# Patient Record
Sex: Female | Born: 1980 | Marital: Married | Smoking: Former smoker
Health system: Southern US, Community
[De-identification: ages and names within clinical notes are randomized; demographics above are authoritative.]

## PROBLEM LIST (undated history)

## (undated) DIAGNOSIS — T7840XA Allergy, unspecified, initial encounter: Secondary | ICD-10-CM

## (undated) DIAGNOSIS — M47816 Spondylosis without myelopathy or radiculopathy, lumbar region: Secondary | ICD-10-CM

## (undated) HISTORY — PX: WISDOM TOOTH EXTRACTION: SHX21

## (undated) HISTORY — DX: Allergy, unspecified, initial encounter: T78.40XA

## (undated) HISTORY — DX: Spondylosis without myelopathy or radiculopathy, lumbar region: M47.816

---

## 2012-09-19 ENCOUNTER — Ambulatory Visit (INDEPENDENT_AMBULATORY_CARE_PROVIDER_SITE_OTHER): Payer: Self-pay | Admitting: Family Medicine

## 2012-09-19 ENCOUNTER — Encounter: Payer: Self-pay | Admitting: Family Medicine

## 2012-09-19 VITALS — BP 112/70 | HR 62 | Ht 63.0 in | Wt 145.0 lb

## 2012-09-19 DIAGNOSIS — Z01419 Encounter for gynecological examination (general) (routine) without abnormal findings: Secondary | ICD-10-CM

## 2012-09-19 DIAGNOSIS — Z124 Encounter for screening for malignant neoplasm of cervix: Secondary | ICD-10-CM

## 2012-09-19 DIAGNOSIS — N390 Urinary tract infection, site not specified: Secondary | ICD-10-CM | POA: Insufficient documentation

## 2012-09-19 DIAGNOSIS — N915 Oligomenorrhea, unspecified: Secondary | ICD-10-CM

## 2012-09-19 DIAGNOSIS — M545 Low back pain: Secondary | ICD-10-CM | POA: Insufficient documentation

## 2012-09-19 DIAGNOSIS — Z1151 Encounter for screening for human papillomavirus (HPV): Secondary | ICD-10-CM

## 2012-09-19 DIAGNOSIS — N912 Amenorrhea, unspecified: Secondary | ICD-10-CM

## 2012-09-19 DIAGNOSIS — Z113 Encounter for screening for infections with a predominantly sexual mode of transmission: Secondary | ICD-10-CM

## 2012-09-19 DIAGNOSIS — R300 Dysuria: Secondary | ICD-10-CM | POA: Insufficient documentation

## 2012-09-19 LAB — POCT URINALYSIS DIPSTICK
Glucose, UA: NEGATIVE
Ketones, UA: NEGATIVE
Protein, UA: NEGATIVE

## 2012-09-19 MED ORDER — NORETHINDRONE-ETH ESTRADIOL 0.4-35 MG-MCG PO TABS: 1.0000 | ORAL_TABLET | Freq: Every day | ORAL | Status: DC

## 2012-09-19 NOTE — Patient Instructions (Signed)
Preventive Care for Adults, Female A healthy lifestyle and preventive care can promote health and wellness. Preventive health guidelines for women include the following key practices.  A routine yearly physical is a good way to check with your caregiver about your health and preventive screening. It is a chance to share any concerns and updates on your health, and to receive a thorough exam.  Visit your dentist for a routine exam and preventive care every 6 months. Brush your teeth twice a day and floss once a day. Good oral hygiene prevents tooth decay and gum disease.  The frequency of eye exams is based on your age, health, family medical history, use of contact lenses, and other factors. Follow your caregiver's recommendations for frequency of eye exams.  Eat a healthy diet. Foods like vegetables, fruits, whole grains, low-fat dairy products, and lean protein foods contain the nutrients you need without too many calories. Decrease your intake of foods high in solid fats, added sugars, and salt. Eat the right amount of calories for you.Get information about a proper diet from your caregiver, if necessary.  Regular physical exercise is one of the most important things you can do for your health. Most adults should get at least 150 minutes of moderate-intensity exercise (any activity that increases your heart rate and causes you to sweat) each week. In addition, most adults need muscle-strengthening exercises on 2 or more days a week.  Maintain a healthy weight. The body mass index (BMI) is a screening tool to identify possible weight problems. It provides an estimate of body fat based on height and weight. Your caregiver can help determine your BMI, and can help you achieve or maintain a healthy weight.For adults 20 years and older:  A BMI below 18.5 is considered underweight.  A BMI of 18.5 to 24.9 is normal.  A BMI of 25 to 29.9 is considered overweight.  A BMI of 30 and above is  considered obese.  Maintain normal blood lipids and cholesterol levels by exercising and minimizing your intake of saturated fat. Eat a balanced diet with plenty of fruit and vegetables. Blood tests for lipids and cholesterol should begin at age 20 and be repeated every 5 years. If your lipid or cholesterol levels are high, you are over 50, or you are at high risk for heart disease, you may need your cholesterol levels checked more frequently.Ongoing high lipid and cholesterol levels should be treated with medicines if diet and exercise are not effective.  If you smoke, find out from your caregiver how to quit. If you do not use tobacco, do not start.  If you are pregnant, do not drink alcohol. If you are breastfeeding, be very cautious about drinking alcohol. If you are not pregnant and choose to drink alcohol, do not exceed 1 drink per day. One drink is considered to be 12 ounces (355 mL) of beer, 5 ounces (148 mL) of wine, or 1.5 ounces (44 mL) of liquor.  Avoid use of street drugs. Do not share needles with anyone. Ask for help if you need support or instructions about stopping the use of drugs.  High blood pressure causes heart disease and increases the risk of stroke. Your blood pressure should be checked at least every 1 to 2 years. Ongoing high blood pressure should be treated with medicines if weight loss and exercise are not effective.  If you are 55 to 33 years old, ask your caregiver if you should take aspirin to prevent strokes.  Diabetes   screening involves taking a blood sample to check your fasting blood sugar level. This should be done once every 3 years, after age 45, if you are within normal weight and without risk factors for diabetes. Testing should be considered at a younger age or be carried out more frequently if you are overweight and have at least 1 risk factor for diabetes.  Breast cancer screening is essential preventive care for women. You should practice "breast  self-awareness." This means understanding the normal appearance and feel of your breasts and may include breast self-examination. Any changes detected, no matter how small, should be reported to a caregiver. Women in their 20s and 30s should have a clinical breast exam (CBE) by a caregiver as part of a regular health exam every 1 to 3 years. After age 40, women should have a CBE every year. Starting at age 40, women should consider having a mammography (breast X-ray test) every year. Women who have a family history of breast cancer should talk to their caregiver about genetic screening. Women at a high risk of breast cancer should talk to their caregivers about having magnetic resonance imaging (MRI) and a mammography every year.  The Pap test is a screening test for cervical cancer. A Pap test can show cell changes on the cervix that might become cervical cancer if left untreated. A Pap test is a procedure in which cells are obtained and examined from the lower end of the uterus (cervix).  Women should have a Pap test starting at age 21.  Between ages 21 and 29, Pap tests should be repeated every 2 years.  Beginning at age 30, you should have a Pap test every 3 years as long as the past 3 Pap tests have been normal.  Some women have medical problems that increase the chance of getting cervical cancer. Talk to your caregiver about these problems. It is especially important to talk to your caregiver if a new problem develops soon after your last Pap test. In these cases, your caregiver may recommend more frequent screening and Pap tests.  The above recommendations are the same for women who have or have not gotten the vaccine for human papillomavirus (HPV).  If you had a hysterectomy for a problem that was not cancer or a condition that could lead to cancer, then you no longer need Pap tests. Even if you no longer need a Pap test, a regular exam is a good idea to make sure no other problems are  starting.  If you are between ages 65 and 70, and you have had normal Pap tests going back 10 years, you no longer need Pap tests. Even if you no longer need a Pap test, a regular exam is a good idea to make sure no other problems are starting.  If you have had past treatment for cervical cancer or a condition that could lead to cancer, you need Pap tests and screening for cancer for at least 20 years after your treatment.  If Pap tests have been discontinued, risk factors (such as a new sexual partner) need to be reassessed to determine if screening should be resumed.  The HPV test is an additional test that may be used for cervical cancer screening. The HPV test looks for the virus that can cause the cell changes on the cervix. The cells collected during the Pap test can be tested for HPV. The HPV test could be used to screen women aged 30 years and older, and should   be used in women of any age who have unclear Pap test results. After the age of 30, women should have HPV testing at the same frequency as a Pap test.  Colorectal cancer can be detected and often prevented. Most routine colorectal cancer screening begins at the age of 50 and continues through age 75. However, your caregiver may recommend screening at an earlier age if you have risk factors for colon cancer. On a yearly basis, your caregiver may provide home test kits to check for hidden blood in the stool. Use of a small camera at the end of a tube, to directly examine the colon (sigmoidoscopy or colonoscopy), can detect the earliest forms of colorectal cancer. Talk to your caregiver about this at age 50, when routine screening begins. Direct examination of the colon should be repeated every 5 to 10 years through age 75, unless early forms of pre-cancerous polyps or small growths are found.  Hepatitis C blood testing is recommended for all people born from 1945 through 1965 and any individual with known risks for hepatitis C.  Practice  safe sex. Use condoms and avoid high-risk sexual practices to reduce the spread of sexually transmitted infections (STIs). STIs include gonorrhea, chlamydia, syphilis, trichomonas, herpes, HPV, and human immunodeficiency virus (HIV). Herpes, HIV, and HPV are viral illnesses that have no cure. They can result in disability, cancer, and death. Sexually active women aged 25 and younger should be checked for chlamydia. Older women with new or multiple partners should also be tested for chlamydia. Testing for other STIs is recommended if you are sexually active and at increased risk.  Osteoporosis is a disease in which the bones lose minerals and strength with aging. This can result in serious bone fractures. The risk of osteoporosis can be identified using a bone density scan. Women ages 65 and over and women at risk for fractures or osteoporosis should discuss screening with their caregivers. Ask your caregiver whether you should take a calcium supplement or vitamin D to reduce the rate of osteoporosis.  Menopause can be associated with physical symptoms and risks. Hormone replacement therapy is available to decrease symptoms and risks. You should talk to your caregiver about whether hormone replacement therapy is right for you.  Use sunscreen with sun protection factor (SPF) of 30 or more. Apply sunscreen liberally and repeatedly throughout the day. You should seek shade when your shadow is shorter than you. Protect yourself by wearing long sleeves, pants, a wide-brimmed hat, and sunglasses year round, whenever you are outdoors.  Once a month, do a whole body skin exam, using a mirror to look at the skin on your back. Notify your caregiver of new moles, moles that have irregular borders, moles that are larger than a pencil eraser, or moles that have changed in shape or color.  Stay current with required immunizations.  Influenza. You need a dose every fall (or winter). The composition of the flu vaccine  changes each year, so being vaccinated once is not enough.  Pneumococcal polysaccharide. You need 1 to 2 doses if you smoke cigarettes or if you have certain chronic medical conditions. You need 1 dose at age 65 (or older) if you have never been vaccinated.  Tetanus, diphtheria, pertussis (Tdap, Td). Get 1 dose of Tdap vaccine if you are younger than age 65, are over 65 and have contact with an infant, are a healthcare worker, are pregnant, or simply want to be protected from whooping cough. After that, you need a Td   booster dose every 10 years. Consult your caregiver if you have not had at least 3 tetanus and diphtheria-containing shots sometime in your life or have a deep or dirty wound.  HPV. You need this vaccine if you are a woman age 26 or younger. The vaccine is given in 3 doses over 6 months.  Measles, mumps, rubella (MMR). You need at least 1 dose of MMR if you were born in 1957 or later. You may also need a second dose.  Meningococcal. If you are age 19 to 21 and a first-year college student living in a residence hall, or have one of several medical conditions, you need to get vaccinated against meningococcal disease. You may also need additional booster doses.  Zoster (shingles). If you are age 60 or older, you should get this vaccine.  Varicella (chickenpox). If you have never had chickenpox or you were vaccinated but received only 1 dose, talk to your caregiver to find out if you need this vaccine.  Hepatitis A. You need this vaccine if you have a specific risk factor for hepatitis A virus infection or you simply wish to be protected from this disease. The vaccine is usually given as 2 doses, 6 to 18 months apart.  Hepatitis B. You need this vaccine if you have a specific risk factor for hepatitis B virus infection or you simply wish to be protected from this disease. The vaccine is given in 3 doses, usually over 6 months. Preventive Services / Frequency Ages 19 to 39  Blood  pressure check.** / Every 1 to 2 years.  Lipid and cholesterol check.** / Every 5 years beginning at age 20.  Clinical breast exam.** / Every 3 years for women in their 20s and 30s.  Pap test.** / Every 2 years from ages 21 through 29. Every 3 years starting at age 30 through age 65 or 70 with a history of 3 consecutive normal Pap tests.  HPV screening.** / Every 3 years from ages 30 through ages 65 to 70 with a history of 3 consecutive normal Pap tests.  Hepatitis C blood test.** / For any individual with known risks for hepatitis C.  Skin self-exam. / Monthly.  Influenza immunization.** / Every year.  Pneumococcal polysaccharide immunization.** / 1 to 2 doses if you smoke cigarettes or if you have certain chronic medical conditions.  Tetanus, diphtheria, pertussis (Tdap, Td) immunization. / A one-time dose of Tdap vaccine. After that, you need a Td booster dose every 10 years.  HPV immunization. / 3 doses over 6 months, if you are 26 and younger.  Measles, mumps, rubella (MMR) immunization. / You need at least 1 dose of MMR if you were born in 1957 or later. You may also need a second dose.  Meningococcal immunization. / 1 dose if you are age 19 to 21 and a first-year college student living in a residence hall, or have one of several medical conditions, you need to get vaccinated against meningococcal disease. You may also need additional booster doses.  Varicella immunization.** / Consult your caregiver.  Hepatitis A immunization.** / Consult your caregiver. 2 doses, 6 to 18 months apart.  Hepatitis B immunization.** / Consult your caregiver. 3 doses usually over 6 months. Ages 40 to 64  Blood pressure check.** / Every 1 to 2 years.  Lipid and cholesterol check.** / Every 5 years beginning at age 20.  Clinical breast exam.** / Every year after age 40.  Mammogram.** / Every year beginning at age 40   and continuing for as long as you are in good health. Consult with your  caregiver.  Pap test.** / Every 3 years starting at age 30 through age 65 or 70 with a history of 3 consecutive normal Pap tests.  HPV screening.** / Every 3 years from ages 30 through ages 65 to 70 with a history of 3 consecutive normal Pap tests.  Fecal occult blood test (FOBT) of stool. / Every year beginning at age 50 and continuing until age 75. You may not need to do this test if you get a colonoscopy every 10 years.  Flexible sigmoidoscopy or colonoscopy.** / Every 5 years for a flexible sigmoidoscopy or every 10 years for a colonoscopy beginning at age 50 and continuing until age 75.  Hepatitis C blood test.** / For all people born from 1945 through 1965 and any individual with known risks for hepatitis C.  Skin self-exam. / Monthly.  Influenza immunization.** / Every year.  Pneumococcal polysaccharide immunization.** / 1 to 2 doses if you smoke cigarettes or if you have certain chronic medical conditions.  Tetanus, diphtheria, pertussis (Tdap, Td) immunization.** / A one-time dose of Tdap vaccine. After that, you need a Td booster dose every 10 years.  Measles, mumps, rubella (MMR) immunization. / You need at least 1 dose of MMR if you were born in 1957 or later. You may also need a second dose.  Varicella immunization.** / Consult your caregiver.  Meningococcal immunization.** / Consult your caregiver.  Hepatitis A immunization.** / Consult your caregiver. 2 doses, 6 to 18 months apart.  Hepatitis B immunization.** / Consult your caregiver. 3 doses, usually over 6 months. Ages 65 and over  Blood pressure check.** / Every 1 to 2 years.  Lipid and cholesterol check.** / Every 5 years beginning at age 20.  Clinical breast exam.** / Every year after age 40.  Mammogram.** / Every year beginning at age 40 and continuing for as long as you are in good health. Consult with your caregiver.  Pap test.** / Every 3 years starting at age 30 through age 65 or 70 with a 3  consecutive normal Pap tests. Testing can be stopped between 65 and 70 with 3 consecutive normal Pap tests and no abnormal Pap or HPV tests in the past 10 years.  HPV screening.** / Every 3 years from ages 30 through ages 65 or 70 with a history of 3 consecutive normal Pap tests. Testing can be stopped between 65 and 70 with 3 consecutive normal Pap tests and no abnormal Pap or HPV tests in the past 10 years.  Fecal occult blood test (FOBT) of stool. / Every year beginning at age 50 and continuing until age 75. You may not need to do this test if you get a colonoscopy every 10 years.  Flexible sigmoidoscopy or colonoscopy.** / Every 5 years for a flexible sigmoidoscopy or every 10 years for a colonoscopy beginning at age 50 and continuing until age 75.  Hepatitis C blood test.** / For all people born from 1945 through 1965 and any individual with known risks for hepatitis C.  Osteoporosis screening.** / A one-time screening for women ages 65 and over and women at risk for fractures or osteoporosis.  Skin self-exam. / Monthly.  Influenza immunization.** / Every year.  Pneumococcal polysaccharide immunization.** / 1 dose at age 65 (or older) if you have never been vaccinated.  Tetanus, diphtheria, pertussis (Tdap, Td) immunization. / A one-time dose of Tdap vaccine if you are over   65 and have contact with an infant, are a healthcare worker, or simply want to be protected from whooping cough. After that, you need a Td booster dose every 10 years.  Varicella immunization.** / Consult your caregiver.  Meningococcal immunization.** / Consult your caregiver.  Hepatitis A immunization.** / Consult your caregiver. 2 doses, 6 to 18 months apart.  Hepatitis B immunization.** / Check with your caregiver. 3 doses, usually over 6 months. ** Family history and personal history of risk and conditions may change your caregiver's recommendations. Document Released: 10/12/2001 Document Revised: 11/08/2011  Document Reviewed: 01/11/2011 [employer] Patient Information 2013 [employer].  

## 2012-09-19 NOTE — Progress Notes (Signed)
  Subjective:     Tonya Garrett is a 33 y.o. female and is here for a comprehensive physical exam. The patient reports problems - dysuria, chronic low back pain, worse when lying down to sleep.  Also present when up and working.  Irregular cycles.  Neg UPT today and at home.  Interested in OC's.  From [place] and recently married.Marland Kitchen  History   Social History  . Marital Status: Married    Spouse Name: N/A    Number of Children: N/A  . Years of Education: N/A   Occupational History  . Not on file.   Social History Main Topics  . Smoking status: Current Some Day Smoker    Types: Cigars  . Smokeless tobacco: Not on file     Comment: pt is trying to quit.  . Alcohol Use: Yes     Comment: social  . Drug Use: No  . Sexually Active: Yes -- Female partner(s)    Birth Control/ Protection: None   Other Topics Concern  . Not on file   Social History Narrative  . No narrative on file   No health maintenance topics applied.  The following portions of the patient's history were reviewed and updated as appropriate: allergies, current medications, past family history, past medical history, past social history, past surgical history and problem list.  Review of Systems Pertinent items are noted in HPI.   Objective:    BP 112/70  Pulse 62  Ht 5\' 3"  (1.6 m)  Wt 145 lb (65.772 kg)  BMI 25.69 kg/m2  LMP 08/25/2012 General appearance: alert, cooperative and appears stated age Head: Normocephalic, without obvious abnormality, atraumatic Neck: no adenopathy, supple, symmetrical, trachea midline and thyroid not enlarged, symmetric, no tenderness/mass/nodules Back: symmetric, no curvature. ROM normal. No CVA tenderness. Lungs: clear to auscultation bilaterally Breasts: normal appearance, no masses or tenderness Heart: regular rate and rhythm, S1, S2 normal, no murmur, click, rub or gallop Abdomen: soft, non-tender; bowel sounds normal; no masses,  no organomegaly Pelvic: cervix normal in  appearance, external genitalia normal, no adnexal masses or tenderness, no cervical motion tenderness, uterus normal size, shape, and consistency and vagina normal without discharge Extremities: extremities normal, atraumatic, no cyanosis or edema Pulses: 2+ and symmetric Skin: Skin color, texture, turgor normal. No rashes or lesions Lymph nodes: Cervical, supraclavicular, and axillary nodes normal. Neurologic: Grossly normal    Assessment:    Healthy female exam.  Low back pain Oligomenorrhea Dysuria STD screen     Plan:  Pap today STD testing Annual blood work OC's for contraception and cycle control PCP referral   See After Visit Summary for Counseling Recommendations

## 2012-09-19 NOTE — Addendum Note (Signed)
Addended by: Reva Bores on: 09/19/2012 01:57 PM   Modules accepted: Orders

## 2012-09-20 LAB — RPR

## 2012-09-20 LAB — COMPREHENSIVE METABOLIC PANEL
Albumin: 4.3 g/dL (ref 3.5–5.2)
BUN: 15 mg/dL (ref 6–23)
Calcium: 9.4 mg/dL (ref 8.4–10.5)
Chloride: 103 mEq/L (ref 96–112)
Creat: 0.65 mg/dL (ref 0.50–1.10)
Glucose, Bld: 64 mg/dL — ABNORMAL LOW (ref 70–99)
Potassium: 4.2 mEq/L (ref 3.5–5.3)

## 2012-09-20 LAB — LIPID PANEL
Cholesterol: 164 mg/dL (ref 0–200)
HDL: 55 mg/dL (ref >39)
Total CHOL/HDL Ratio: 3 Ratio
VLDL: 18 mg/dL (ref 0–40)

## 2012-09-20 LAB — TSH: TSH: 2.95 u[IU]/mL (ref 0.350–4.500)

## 2012-09-20 LAB — CBC
HCT: 42 % (ref 36.0–46.0)
Hemoglobin: 14.4 g/dL (ref 12.0–15.0)
MCHC: 34.3 g/dL (ref 30.0–36.0)
MCV: 89.9 fL (ref 78.0–100.0)
WBC: 5.6 10*3/uL (ref 4.0–10.5)

## 2012-09-21 MED ORDER — CIPROFLOXACIN HCL 500 MG PO TABS: 500.0000 mg | ORAL_TABLET | Freq: Two times a day (BID) | ORAL | Status: DC

## 2012-09-21 NOTE — Addendum Note (Signed)
Addended by: Reva Bores on: 09/21/2012 01:43 PM   Modules accepted: Orders

## 2012-09-21 NOTE — Progress Notes (Signed)
Patient ID: Tonya Garrett, female   DOB: 1981/04/12, 32 y.o.   MRN: [id number] Pt. With E.coli UTI--will treat with Cipro

## 2012-09-22 LAB — URINE CULTURE: Colony Count: >=100000

## 2012-11-09 ENCOUNTER — Ambulatory Visit (INDEPENDENT_AMBULATORY_CARE_PROVIDER_SITE_OTHER)
Admission: RE | Admit: 2012-11-09 | Discharge: 2012-11-09 | Disposition: A | Discharge status: Home / Self Care | Payer: PRIVATE HEALTH INSURANCE | Source: Ambulatory Visit | Attending: Family Medicine | Admitting: Family Medicine

## 2012-11-09 ENCOUNTER — Ambulatory Visit (INDEPENDENT_AMBULATORY_CARE_PROVIDER_SITE_OTHER): Payer: PRIVATE HEALTH INSURANCE | Admitting: Family Medicine

## 2012-11-09 ENCOUNTER — Encounter: Payer: Self-pay | Admitting: Family Medicine

## 2012-11-09 VITALS — BP 108/68 | HR 90 | Temp 98.3°F | Ht 64.0 in | Wt 142.0 lb

## 2012-11-09 DIAGNOSIS — M545 Low back pain: Secondary | ICD-10-CM | POA: Insufficient documentation

## 2012-11-09 DIAGNOSIS — M79605 Pain in left leg: Secondary | ICD-10-CM

## 2012-11-09 NOTE — Patient Instructions (Addendum)
Nice to meet you. We will call you with your xray results. Please see Shirlee Limerick to set up your physical therapy.  Ibuprofen or allege as needed for pain.   Please follow up in 4-6 weeks.

## 2012-11-09 NOTE — Progress Notes (Signed)
  Subjective:    Patient ID: Tonya Garrett, female    DOB: 08-23-1981, 32 y.o.   MRN: [id number]  HPI  Very pleasant 32 yo female here to establish care. Recently moved here from [place].  Established with Dr. Shawnie Pons recently who referred her here for back pain.  She complains of low back pain for 5 month(s), positional with bending or lifting, with radiation down left leg. 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  Pain seems to be worse at night.   Prior history of back problems: recurrent self limited episodes of low back pain in the past. There is no numbness in the legs.  Patient Active Problem List  Diagnosis  . Low back pain  . Hypomenorrhea/oligomenorrhea  . Lumbar pain with radiation down left leg   Past Medical History  Diagnosis Date  . Allergy    Past Surgical History  Procedure Laterality Date  . Cesarean section    . Wisdom tooth extraction     History  Substance Use Topics  . Smoking status: Former Smoker    Types: Cigarettes, Cigars    Quit date: 07/12/2012  . Smokeless tobacco: Never Used     Comment: pt is trying to quit.  . Alcohol Use: Yes     Comment: social   Family History  Problem Relation Age of Onset  . Hypertension Mother   . Cancer Father     prancreas   Allergies  Allergen Reactions  . Sulfa Antibiotics Shortness Of Breath and Rash  . Penicillins    No current outpatient prescriptions on file prior to visit.   No current facility-administered medications on file prior to visit.   The PMH, PSH, Social History, Family History, Medications, and allergies have been reviewed in [facility], and have been updated if relevant.   Review of Systems See HPI No urinary symptoms No fevers No night sweats     Objective:   Physical Exam BP 108/68  Pulse 90  Temp(Src) 98.3 F (36.8 C) (Oral)  Ht 5\' 4"   (1.626 m)  Wt 142 lb (64.411 kg)  BMI 24.36 kg/m2  SpO2 98%  Very pleasant female in NAD. Lumbosacral spine area reveals no local tenderness or mass.  Painful and reduced LS ROM noted. Straight leg raise is negative bilaterally,  DTR's, motor strength and sensation normal, including heel and toe gait.  Peripheral pulses are palpable. X-Ray: ordered, but results not yet available.    Assessment & Plan:  1. Lumbar pain with radiation down left leg Given duration of symptoms, imaging in appropriate.  Will order xray today and refer to PT.  If pain persists, she would likely benefit from course of steroids and or further imaging.  The patient indicates understanding of these issues and agrees with the plan.  - DG Lumbar Spine Complete; Future - Ambulatory referral to Physical Therapy

## 2012-11-13 ENCOUNTER — Ambulatory Visit: Payer: Self-pay | Admitting: Family Medicine

## 2012-11-21 ENCOUNTER — Encounter: Payer: Self-pay | Admitting: Family Medicine

## 2012-11-28 ENCOUNTER — Encounter: Payer: Self-pay | Admitting: Family Medicine

## 2012-12-14 ENCOUNTER — Ambulatory Visit (INDEPENDENT_AMBULATORY_CARE_PROVIDER_SITE_OTHER): Payer: PRIVATE HEALTH INSURANCE | Admitting: Family Medicine

## 2012-12-14 ENCOUNTER — Ambulatory Visit: Payer: PRIVATE HEALTH INSURANCE | Admitting: Family Medicine

## 2012-12-14 ENCOUNTER — Encounter: Payer: Self-pay | Admitting: Family Medicine

## 2012-12-14 VITALS — BP 100/62 | HR 76 | Temp 98.1°F | Wt 145.0 lb

## 2012-12-14 DIAGNOSIS — M545 Low back pain: Secondary | ICD-10-CM

## 2012-12-14 MED ORDER — PREDNISONE 20 MG PO TABS: ORAL_TABLET | ORAL | Status: DC

## 2012-12-14 NOTE — Patient Instructions (Addendum)
Good to see you. Please take prednisone as directed and call me in 2 weeks after you have completed the course. If pain persists, we will pursue MRI and/or orthopedic referral.

## 2012-12-14 NOTE — Progress Notes (Signed)
Subjective:    Patient ID: Tonya Garrett, female    DOB: 1980-09-03, 32 y.o.   MRN: [id number]  HPI  Very pleasant 32 yo female here for one month follow up.  When she establish care last month, she complained of 5 months of low back pain,  positional with bending or lifting, with radiation down left leg. 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  Pain seems to be worse at night.   Prior history of back problems: recurrent self limited episodes of low back pain in the past.   No numbness in legs.  Xray of lumbar spine showed scoliosis, otherwise unremarkable.  *RADIOLOGY REPORT*  Clinical Data: Low back pain with radicular some to  LUMBAR SPINE - COMPLETE 4+ VIEW  Comparison: None.  Findings: Frontal, lateral, spot lumbosacral lateral, and  bilateral oblique views were obtained. There are five non-rib  bearing lumbar type vertebral bodies. There is lumbar  levoscoliosis. There is no fracture or spondylolisthesis. Disc  spaces appear intact. There is no appreciable facet arthropathy.  IMPRESSION:  Scoliosis. No appreciable arthropathy. No fractures.  Referred to PT and advised supportive care.  She has been going to PT.  Has not noticed any improvement.    Patient Active Problem List  Diagnosis  . Low back pain  . Hypomenorrhea/oligomenorrhea  . Lumbar pain with radiation down left leg   Past Medical History  Diagnosis Date  . Allergy    Past Surgical History  Procedure Laterality Date  . Cesarean section    . Wisdom tooth extraction     History  Substance Use Topics  . Smoking status: Former Smoker    Types: Cigarettes, Cigars    Quit date: 07/12/2012  . Smokeless tobacco: Never Used     Comment: pt is trying to quit.  . Alcohol Use: Yes     Comment: social   Family History  Problem Relation Age of Onset  .  Hypertension Mother   . Cancer Father     prancreas   Allergies  Allergen Reactions  . Sulfa Antibiotics Shortness Of Breath and Rash  . Penicillins    Current Outpatient Prescriptions on File Prior to Visit  Medication Sig Dispense Refill  . norethindrone-ethinyl estradiol (OVCON-35,BALZIVA,BRIELLYN) 0.4-35 MG-MCG tablet Take 1 tablet by mouth daily.       No current facility-administered medications on file prior to visit.   The PMH, PSH, Social History, Family History, Medications, and allergies have been reviewed in [facility], and have been updated if relevant.   Review of Systems See HPI No urinary symptoms No fevers No night sweats     Objective:   Physical Exam BP 100/62  Pulse 76  Temp(Src) 98.1 F (36.7 C)  Wt 145 lb (65.772 kg)  BMI 24.88 kg/m2  Very pleasant female in NAD. Lumbosacral spine area reveals no local tenderness or mass.  Painful and reduced LS ROM noted. Straight leg raise is negative bilaterally,  DTR's, motor strength and sensation normal, including heel and toe gait.  Peripheral pulses are palpable. X-Ray: ordered, but results not yet available.    Assessment & Plan:  1. Lumbar pain with radiation down left leg  Unchanged.  Will place on course of prednsione. If no improvement, will order MRI and/or PT referral. The patient indicates understanding of these issues and agrees with the plan.

## 2012-12-28 ENCOUNTER — Encounter: Payer: Self-pay | Admitting: Family Medicine

## 2013-01-23 ENCOUNTER — Telehealth: Payer: Self-pay | Admitting: Family Medicine

## 2013-01-23 NOTE — Telephone Encounter (Signed)
Letter written stating pt was not seen in this office prior to 11/13/12.  Patient to pick up.

## 2013-01-23 NOTE — Telephone Encounter (Signed)
Pt dropped off some paperwork requesting a note from Dr. Dayton Martes for insurance purposes stating she has not been seen by Dr. Dayton Martes prior to July 22, 2012. I have put the info on your desk. Thank you.

## 2013-01-24 ENCOUNTER — Encounter: Payer: Self-pay | Admitting: *Deleted

## 2013-06-01 ENCOUNTER — Ambulatory Visit: Payer: PRIVATE HEALTH INSURANCE | Admitting: Family Medicine

## 2013-06-05 ENCOUNTER — Encounter: Payer: Self-pay | Admitting: Obstetrics & Gynecology

## 2013-06-05 ENCOUNTER — Ambulatory Visit (INDEPENDENT_AMBULATORY_CARE_PROVIDER_SITE_OTHER): Payer: 59 | Admitting: Obstetrics & Gynecology

## 2013-06-05 VITALS — BP 119/81 | HR 73 | Ht 64.0 in | Wt 150.0 lb

## 2013-06-05 DIAGNOSIS — L680 Hirsutism: Secondary | ICD-10-CM

## 2013-06-05 MED ORDER — NORGESTREL-ETHINYL ESTRADIOL 0.3-30 MG-MCG PO TABS: 1.0000 | ORAL_TABLET | Freq: Every day | ORAL | Status: DC

## 2013-06-05 NOTE — Progress Notes (Signed)
  Subjective:    Patient ID: Tonya Garrett, female    DOB: Jun 05, 1981, 32 y.o.   MRN: [id number]  HPI  32 yo M lady who is here today for 2 reasons. The first is that she still is not having regular periods with her OCPs. She stopped taking them last week because she wanted to check a pregnancy test. It was negative. She had missed a couple of pills in the last month.  The other issue is that of hair growth on her face and nipples and abdomen. She is shaving it recently. She had laser treatment of her legs and abdomen, face, and bikini done about 18 months ago in [place].  Review of Systems     Objective:   Physical Exam        Assessment & Plan:   Hirsutism- check free testosterone. If normal, rec referral to endocrinologist/dermatologist  Desire for period- I will change her OCPs to Lo/Ovral

## 2013-08-30 DIAGNOSIS — M47816 Spondylosis without myelopathy or radiculopathy, lumbar region: Secondary | ICD-10-CM

## 2013-08-30 HISTORY — DX: Spondylosis without myelopathy or radiculopathy, lumbar region: M47.816

## 2014-04-26 ENCOUNTER — Ambulatory Visit (INDEPENDENT_AMBULATORY_CARE_PROVIDER_SITE_OTHER): Payer: BC Managed Care – PPO | Admitting: Family Medicine

## 2014-04-26 ENCOUNTER — Ambulatory Visit: Payer: 59 | Admitting: Internal Medicine

## 2014-04-26 ENCOUNTER — Encounter: Payer: Self-pay | Admitting: Family Medicine

## 2014-04-26 VITALS — BP 104/62 | HR 64 | Temp 98.5°F | Wt 153.5 lb

## 2014-04-26 DIAGNOSIS — M545 Low back pain, unspecified: Secondary | ICD-10-CM

## 2014-04-26 MED ORDER — METHOCARBAMOL 500 MG PO TABS: 500.0000 mg | ORAL_TABLET | Freq: Three times a day (TID) | ORAL | Status: DC | PRN

## 2014-04-26 NOTE — Progress Notes (Signed)
Pre visit review using our clinic review tool, if applicable. No additional management support is needed unless otherwise documented below in the visit note. 

## 2014-04-26 NOTE — Assessment & Plan Note (Signed)
?  bulging disc related pain vs R sacroiliac joint dysfunction - exam more suspicious for R SIJ dysfunction. Given longstanding nature will refer to ortho for further evaluation, to discuss possible therapeutic and diagnostic SIJ steroid injection. In interim, try muscle relaxant robaxin course, provided with stretching exercises from [facility] pt advisor on sacroiliac pain. Avoid NSAIDs as OTC NSAID causing some GI distress. Pt agrees with plan.

## 2014-04-26 NOTE — Patient Instructions (Addendum)
Try robaxin as muscle relaxant (may make you sleepy). I an suspicious for sacroiliac joint inflammation - we will refer you to orthopedic doctor to further evaluate this. Try stretching exercises provided today in the interim. Continue elliptical.

## 2014-04-26 NOTE — Progress Notes (Signed)
BP 104/62  Pulse 64  Temp(Src) 98.5 F (36.9 C) (Oral)  Wt 153 lb 8 oz (69.627 kg)  LMP 04/07/2014   CC: LBP  Subjective:    Patient ID: Tonya Garrett, female    DOB: May 14, 1981, 33 y.o.   MRN: [id number]  HPI: Tonya Garrett is a 33 y.o. female presenting on 04/26/2014 for Back Pain   Sister in law of Dr. Kirke Corin.  Pleasant 69 yo of Dr. Elmer Sow presents with longstading h/o lower back pain. Seen here 2014 with similar issue. Previous 2 notes reviewed. PT therapy didn't help. Treated with prednisone course which helped but only temporarily (calmed it down only for 1 mo). She does this when she works out at gym (mainly elliptical and just now started stationary bicycle). Now worse pain has returned over 3 months. Some radiation of pain down R leg to knee. Denies numbness/weakness of leg, no bowel/bladder accidents, no fevers/chills. Denies inciting trauma/injury. No other joint pains.  Wt Readings from Last 3 Encounters:  04/26/14 153 lb 8 oz (69.627 kg)  06/05/13 150 lb (68.04 kg)  12/14/12 145 lb (65.772 kg)  Notices she's gained significant weight since moving here from [place] 2 yrs ago On OCP for last 2 yrs  Has been taking ibuprofen and tylenol up to every day - now having some stomach ache attributed to NSAIDs. Trouble sleeping at night 2/2 pain.   1 pregnancy - C-section.  LUMBAR SPINE - COMPLETE 4+ VIEW (10/2012) Comparison: None.  Findings: Frontal, lateral, spot lumbosacral lateral, and  bilateral oblique views were obtained. There are five non-rib  bearing lumbar type vertebral bodies. There is lumbar  levoscoliosis. There is no fracture or spondylolisthesis. Disc  spaces appear intact. There is no appreciable facet arthropathy.  IMPRESSION:  Scoliosis. No appreciable arthropathy. No fractures.  Original Report Authenticated By: Bretta Bang, M.D.   Relevant past medical, surgical, family and social history reviewed and updated as indicated.  Allergies and medications  reviewed and updated. Current Outpatient Prescriptions on File Prior to Visit  Medication Sig  . norgestrel-ethinyl estradiol (LO/OVRAL,CRYSELLE) 0.3-30 MG-MCG tablet Take 1 tablet by mouth daily.   No current facility-administered medications on file prior to visit.    Review of Systems Per HPI unless specifically indicated above    Objective:    BP 104/62  Pulse 64  Temp(Src) 98.5 F (36.9 C) (Oral)  Wt 153 lb 8 oz (69.627 kg)  LMP 04/07/2014  Physical Exam  Nursing note and vitals reviewed. Constitutional: She is oriented to person, place, and time. She appears well-developed and well-nourished. No distress.  Musculoskeletal: She exhibits no edema.  + lumbar midline spine tenderness. No significant paraspinous mm tenderness Neg SLR bilaterally + FABER on left, neg on right No pain with int/ext rotation of bilateral hips Tender to palpation at R SIJ, not at sciatic notch or GTB.  Neurological: She is alert and oriented to person, place, and time.  Reflex Scores:      Patellar reflexes are 1+ on the right side and 1+ on the left side. 5/5 BLE strength Sensation intact       Assessment & Plan:   Problem List Items Addressed This Visit   Low back pain radiating to right leg - Primary     ?bulging disc related pain vs R sacroiliac joint dysfunction - exam more suspicious for R SIJ dysfunction. Given longstanding nature will refer to ortho for further evaluation, to discuss possible therapeutic and diagnostic SIJ steroid injection.  In interim, try muscle relaxant robaxin course, provided with stretching exercises from [facility] pt advisor on sacroiliac pain. Avoid NSAIDs as OTC NSAID causing some GI distress. Pt agrees with plan.    Relevant Medications      methocarbamol (ROBAXIN) tablet       Follow up plan: Return if symptoms worsen or fail to improve.

## 2014-05-08 ENCOUNTER — Other Ambulatory Visit: Payer: Self-pay | Admitting: Obstetrics & Gynecology

## 2014-05-10 ENCOUNTER — Other Ambulatory Visit: Payer: Self-pay | Admitting: *Deleted

## 2014-05-10 DIAGNOSIS — Z3041 Encounter for surveillance of contraceptive pills: Secondary | ICD-10-CM

## 2014-05-10 MED ORDER — NORGESTREL-ETHINYL ESTRADIOL 0.3-30 MG-MCG PO TABS: 1.0000 | ORAL_TABLET | Freq: Every day | ORAL | Status: DC

## 2014-05-10 NOTE — Telephone Encounter (Signed)
Patient called and needed refills on her birth control.  Pt did schedule an appt.

## 2014-05-29 ENCOUNTER — Encounter: Payer: Self-pay | Admitting: Obstetrics & Gynecology

## 2014-05-29 ENCOUNTER — Ambulatory Visit (INDEPENDENT_AMBULATORY_CARE_PROVIDER_SITE_OTHER): Payer: BC Managed Care – PPO | Admitting: Obstetrics & Gynecology

## 2014-05-29 VITALS — BP 117/74 | HR 69 | Ht 65.0 in | Wt 154.0 lb

## 2014-05-29 DIAGNOSIS — Z01419 Encounter for gynecological examination (general) (routine) without abnormal findings: Secondary | ICD-10-CM | POA: Diagnosis not present

## 2014-05-29 DIAGNOSIS — Z1151 Encounter for screening for human papillomavirus (HPV): Secondary | ICD-10-CM | POA: Diagnosis not present

## 2014-05-29 DIAGNOSIS — Z124 Encounter for screening for malignant neoplasm of cervix: Secondary | ICD-10-CM | POA: Diagnosis not present

## 2014-05-29 DIAGNOSIS — Z3041 Encounter for surveillance of contraceptive pills: Secondary | ICD-10-CM

## 2014-05-29 MED ORDER — NORGESTREL-ETHINYL ESTRADIOL 0.3-30 MG-MCG PO TABS: 1.0000 | ORAL_TABLET | Freq: Every day | ORAL | Status: DC

## 2014-05-29 NOTE — Progress Notes (Signed)
Subjective:    Tonya Garrett is a 33 y.o. M G1P1 65(34 yo daughter)  female who presents for an annual exam. The patient has no complaints today. She needs a refill of her lo ovral. Her periods are regular. The patient is sexually active. GYN screening history: last pap: was normal. The patient wears seatbelts: yes. The patient participates in regular exercise: yes. Has the patient ever been transfused or tattooed?: no. The patient reports that there is not domestic violence in her life.   Menstrual History: OB History   Grav Para Term Preterm Abortions TAB SAB Ect Mult Living   1 1 0 1      1      Menarche age: [age]  Patient's last menstrual period was 05/06/2014.    The following portions of the patient's history were reviewed and updated as appropriate: allergies, current medications, past family history, past medical history, past social history, past surgical history and problem list.  Review of Systems A comprehensive review of systems was negative. 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 She denies dyspareunia.   Objective:    BP 117/74  Pulse 69  Ht 5\' 5"  (1.651 m)  Wt 154 lb (69.854 kg)  BMI 25.63 kg/m2  LMP 05/06/2014  General Appearance:    Alert, cooperative, no distress, appears stated age  Head:    Normocephalic, without obvious abnormality, atraumatic  Eyes:    PERRL, conjunctiva/corneas clear, EOM's intact, fundi    benign, both eyes  Ears:    Normal TM's and external ear canals, both ears  Nose:   Nares normal, septum midline, mucosa normal, no drainage    or sinus tenderness  Throat:   Lips, mucosa, and tongue normal; teeth and gums normal  Neck:   Supple, symmetrical, trachea midline, no adenopathy;    thyroid:  no enlargement/tenderness/nodules; no carotid   bruit or JVD  Back:     Symmetric, no curvature, ROM normal, no CVA tenderness  Lungs:     Clear to auscultation bilaterally, respirations unlabored  Chest Wall:    No tenderness or deformity   Heart:     Regular rate and rhythm, S1 and S2 normal, no murmur, rub   or gallop  Breast Exam:    No tenderness, masses, or nipple abnormality  Abdomen:     Soft, non-tender, bowel sounds active all four quadrants,    no masses, no organomegaly, hirsuite but normal testosterone (I suspect that it is related to her heritage)  Genitalia:    Normal female without lesion, discharge or tenderness, ectropion noted, NSSR and retroflexed. Minimal movement, but non-tender, normal adnexal exam     Extremities:   Extremities normal, atraumatic, no cyanosis or edema  Pulses:   2+ and symmetric all extremities  Skin:   Skin color, texture, turgor normal, no rashes or lesions  Lymph nodes:   Cervical, supraclavicular, and axillary nodes normal  Neurologic:   CNII-XII intact, normal strength, sensation and reflexes    throughout  .    Assessment:    Healthy female exam.    Plan:     Breast self exam technique reviewed and patient encouraged to perform self-exam monthly. Thin prep Pap smear. with cotesting

## 2014-05-30 LAB — CYTOLOGY - PAP

## 2014-06-12 ENCOUNTER — Encounter: Payer: Self-pay | Admitting: Family Medicine

## 2014-07-01 ENCOUNTER — Encounter: Payer: Self-pay | Admitting: Family Medicine

## 2015-05-30 ENCOUNTER — Other Ambulatory Visit: Payer: Self-pay | Admitting: Obstetrics & Gynecology

## 2015-07-02 ENCOUNTER — Other Ambulatory Visit: Payer: Self-pay | Admitting: Obstetrics & Gynecology

## 2015-07-07 ENCOUNTER — Telehealth: Payer: Self-pay | Admitting: *Deleted

## 2015-07-07 DIAGNOSIS — Z3041 Encounter for surveillance of contraceptive pills: Secondary | ICD-10-CM

## 2015-07-07 MED ORDER — NORGESTREL-ETHINYL ESTRADIOL 0.3-30 MG-MCG PO TABS: 1.0000 | ORAL_TABLET | Freq: Every day | ORAL | Status: DC

## 2015-07-07 NOTE — Telephone Encounter (Signed)
Sent in a refill to patients pharmacy. Left a message on patient voicemail that she needed to call and schedule an appt.

## 2015-07-22 ENCOUNTER — Encounter: Payer: Self-pay | Admitting: Obstetrics & Gynecology

## 2015-07-22 ENCOUNTER — Ambulatory Visit (INDEPENDENT_AMBULATORY_CARE_PROVIDER_SITE_OTHER): Payer: BLUE CROSS/BLUE SHIELD | Admitting: Obstetrics & Gynecology

## 2015-07-22 VITALS — BP 114/75 | HR 72 | Resp 18 | Ht 64.0 in | Wt 151.0 lb

## 2015-07-22 DIAGNOSIS — Z0000 Encounter for general adult medical examination without abnormal findings: Secondary | ICD-10-CM

## 2015-07-22 DIAGNOSIS — Z23 Encounter for immunization: Secondary | ICD-10-CM

## 2015-07-22 DIAGNOSIS — Z124 Encounter for screening for malignant neoplasm of cervix: Secondary | ICD-10-CM | POA: Diagnosis not present

## 2015-07-22 DIAGNOSIS — Z01419 Encounter for gynecological examination (general) (routine) without abnormal findings: Secondary | ICD-10-CM

## 2015-07-22 DIAGNOSIS — Z1151 Encounter for screening for human papillomavirus (HPV): Secondary | ICD-10-CM | POA: Diagnosis not present

## 2015-07-22 DIAGNOSIS — Z3009 Encounter for other general counseling and advice on contraception: Secondary | ICD-10-CM | POA: Diagnosis not present

## 2015-07-22 MED ORDER — MISOPROSTOL 200 MCG PO TABS: ORAL_TABLET | ORAL | Status: DC

## 2015-07-22 NOTE — Progress Notes (Signed)
Subjective:    Tonya Garrett is a 34 y.o. Arabic 91P1 31(35 yo daughter)  female who presents for an annual exam. The patient has no complaints today except weight gain (about 10 pounds since moving here from [place]) The patient is sexually active. GYN screening history: last pap: was normal. The patient wears seatbelts: yes. The patient participates in regular exercise: yes. Has the patient ever been transfused or tattooed?: no. The patient reports that there is not domestic violence in her life.   Menstrual History: OB History    Gravida Para Term Preterm AB TAB SAB Ectopic Multiple Living   1 1 0 1      1      Menarche age: [age]  Patient's last menstrual period was 06/20/2015 (approximate).    The following portions of the patient's history were reviewed and updated as appropriate: allergies, current medications, past family history, past medical history, past social history, past surgical history and problem list.  Review of Systems Pertinent items noted in HPI and remainder of comprehensive ROS otherwise negative.  Married for 3 years. She is an Technical sales engineerarchitect.   Objective:    BP 114/75 mmHg  Pulse 72  Resp 18  Ht 5\' 4"  (1.626 m)  Wt 151 lb (68.493 kg)  BMI 25.91 kg/m2  LMP 06/20/2015 (Approximate)  General Appearance:    Alert, cooperative, no distress, appears stated age  Head:    Normocephalic, without obvious abnormality, atraumatic  Eyes:    PERRL, conjunctiva/corneas clear, EOM's intact, fundi    benign, both eyes  Ears:    Normal TM's and external ear canals, both ears  Nose:   Nares normal, septum midline, mucosa normal, no drainage    or sinus tenderness  Throat:   Lips, mucosa, and tongue normal; teeth and gums normal  Neck:   Supple, symmetrical, trachea midline, no adenopathy;    thyroid:  no enlargement/tenderness/nodules; no carotid   bruit or JVD  Back:     Symmetric, no curvature, ROM normal, no CVA tenderness  Lungs:     Clear to auscultation bilaterally, respirations  unlabored  Chest Wall:    No tenderness or deformity   Heart:    Regular rate and rhythm, S1 and S2 normal, no murmur, rub   or gallop  Breast Exam:    No tenderness, masses, or nipple abnormality  Abdomen:     Soft, non-tender, bowel sounds active all four quadrants,    no masses, no organomegaly  Genitalia:    Normal female without lesion, discharge or tenderness, NSSR, NT, normal adnexal exam     Extremities:   Extremities normal, atraumatic, no cyanosis or edema  Pulses:   2+ and symmetric all extremities  Skin:   Skin color, texture, turgor normal, no rashes or lesions  Lymph nodes:   Cervical, supraclavicular, and axillary nodes normal  Neurologic:   CNII-XII intact, normal strength, sensation and reflexes    throughout  .    Assessment:    Healthy female exam.   Contraception Plan:     Breast self exam technique reviewed and patient encouraged to perform self-exam monthly. Thin prep Pap smear. TSh  recommended but she declines blood works as she is afraid she will pass out. She wants to change to Mirena. She will use cytotec the night prior to insertion.

## 2015-07-25 LAB — CYTOLOGY - PAP

## 2015-08-01 ENCOUNTER — Encounter: Payer: Self-pay | Admitting: Obstetrics & Gynecology

## 2015-08-01 ENCOUNTER — Ambulatory Visit (INDEPENDENT_AMBULATORY_CARE_PROVIDER_SITE_OTHER): Payer: BLUE CROSS/BLUE SHIELD | Admitting: Obstetrics & Gynecology

## 2015-08-01 ENCOUNTER — Encounter: Payer: Self-pay | Admitting: *Deleted

## 2015-08-01 VITALS — BP 116/77 | HR 62 | Resp 18 | Wt 150.0 lb

## 2015-08-01 DIAGNOSIS — Z01812 Encounter for preprocedural laboratory examination: Secondary | ICD-10-CM

## 2015-08-01 DIAGNOSIS — Z30430 Encounter for insertion of intrauterine contraceptive device: Secondary | ICD-10-CM | POA: Diagnosis not present

## 2015-08-01 LAB — POCT URINE PREGNANCY: Preg Test, Ur: NEGATIVE

## 2015-08-01 MED ORDER — LEVONORGESTREL 20 MCG/24HR IU IUD
1.0000 | INTRAUTERINE_SYSTEM | Freq: Once | INTRAUTERINE | Status: AC
  Administered 2015-08-01: 1 via INTRAUTERINE

## 2015-08-01 NOTE — Progress Notes (Signed)
   Subjective:    Patient ID: Tonya Garrett, female    DOB: 07/29/1981, 34 y.o.   MRN: [id number]  HPI  [place] M lady here for Mirena. She took cytotec last night.  Review of Systems     Objective:   Physical Exam UPT negative, consent signed, Time out procedure done. Cervix prepped with betadine and grasped with a single tooth tenaculum. Mirena was easily placed and the strings were cut to 3-4 cm. Uterus sounded to 9 cm. She tolerated the procedure well.      Assessment & Plan:  Contraception- Mirena Rec back up method for 2 weeks

## 2015-08-22 ENCOUNTER — Ambulatory Visit: Payer: BLUE CROSS/BLUE SHIELD | Admitting: Obstetrics & Gynecology

## 2015-08-22 DIAGNOSIS — Z30431 Encounter for routine checking of intrauterine contraceptive device: Secondary | ICD-10-CM

## 2015-08-28 ENCOUNTER — Ambulatory Visit: Payer: BLUE CROSS/BLUE SHIELD | Admitting: Obstetrics & Gynecology

## 2016-06-23 ENCOUNTER — Ambulatory Visit (INDEPENDENT_AMBULATORY_CARE_PROVIDER_SITE_OTHER): Payer: BLUE CROSS/BLUE SHIELD | Admitting: Obstetrics & Gynecology

## 2016-06-23 ENCOUNTER — Encounter: Payer: Self-pay | Admitting: Obstetrics & Gynecology

## 2016-06-23 VITALS — BP 114/81 | HR 53 | Ht 63.0 in | Wt 140.0 lb

## 2016-06-23 DIAGNOSIS — Z30431 Encounter for routine checking of intrauterine contraceptive device: Secondary | ICD-10-CM

## 2016-06-23 DIAGNOSIS — R102 Pelvic and perineal pain: Secondary | ICD-10-CM

## 2016-06-23 DIAGNOSIS — R350 Frequency of micturition: Secondary | ICD-10-CM

## 2016-06-23 LAB — POCT URINALYSIS DIP (DEVICE)
BILIRUBIN URINE: NEGATIVE
Glucose, UA: NEGATIVE mg/dL
HGB URINE DIPSTICK: NEGATIVE
KETONES UR: NEGATIVE mg/dL
Leukocytes, UA: NEGATIVE
Nitrite: NEGATIVE
PH: 6.5 (ref 5.0–8.0)
Protein, ur: NEGATIVE mg/dL
Urobilinogen, UA: 0.2 mg/dL (ref 0.0–1.0)

## 2016-06-23 NOTE — Progress Notes (Signed)
CLINIC ENCOUNTER NOTE  History:  35 y.o. G1P0101 here today for eval of amenorrhea with Mirena. Pt wasn't sure if this is normal or not.  Pt also reports freq urination- she drinks lots of water and does drink coffee daily one large mug. She reports nocturia but, reports drinking water all day and does not limit her nighttime drinking.  She also reports pain with intercourse. She reports that she feel the IUD moving during intercourse. She denies dysuria or hematuria. She denies Pain outside of intercourse.  Past Medical History:  Diagnosis Date  . Allergy   . Facet arthropathy, lumbar 2015   axial low back pain consistent with facet mediated pain - prominent facet arthropathy and edema bilaterally at L4/5 on MRI Yevette Edwards([place])    Past Surgical History:  Procedure Laterality Date  . CESAREAN SECTION    . WISDOM TOOTH EXTRACTION      The following portions of the patient's history were reviewed and updated as appropriate: allergies, current medications, past family history, past medical history, past social history, past surgical history and problem list.   Health Maintenance:  Normal pap and negative HRHPV on 07/22/2015   Review of Systems:  Pertinent items are noted in HPI. Comprehensive review of systems was otherwise negative.  Objective:  Physical Exam BP 114/81   Pulse (!) 53   Ht 5\' 3"  (1.6 m)   Wt 140 lb (63.5 kg)   BMI 24.80 kg/m  CONSTITUTIONAL: Well-developed, well-nourished female in no acute distress.  HENT:  Normocephalic, atraumatic, External right and left ear normal. Oropharynx is clear and moist SKIN: Skin is warm and dry. No rash noted. Not diaphoretic. No erythema. No pallor. NEUROLGIC: Alert and oriented to person, place, and time. Normal reflexes, muscle tone coordination. No cranial nerve deficit noted. PSYCHIATRIC: Normal mood and affect. Normal behavior. Normal judgment and thought content. CARDIOVASCULAR: Normal heart rate noted RESPIRATORY: Effort and  breath sounds normal, no problems with respiration noted ABDOMEN: Soft, no distention noted.  No tenderness, rebound or guarding.  PELVIC: Normal appearing external genitalia; normal appearing vaginal mucosa and cervix. IUD strings noted coming from the cervical os. Normal appearing discharge.  Normal uterine size, no other palpable masses, no uterine or adnexal tenderness. MUSCULOSKELETAL: Normal range of motion. No edema and no tenderness.  Labs and Imaging UA: neg  Assessment & Plan:  Freq urination.    Check UA  Decrease or remove caffeine from diet  Nocturia:   Limit late night drinking. Rec last drink ~7pm  Amenorrhea with IUD - Normal   Dyspaurnia-  Normal exam  Rec pelvic US Total face-to-face time with patient was 30 min.  Greater than 50% was spent in counseling and coordination of care with the patient. We discussed see above.    Zyaire Dumas L. Erin FullingHarraway-Smith, MD, FACOG Attending Obstetrician & Gynecologist Center for [facility], [facility]

## 2016-06-23 NOTE — Patient Instructions (Addendum)
Levonorgestrel intrauterine device (IUD) What is this medicine? LEVONORGESTREL IUD (LEE voe nor jes trel) is a contraceptive (birth control) device. The device is placed inside the uterus by a healthcare professional. It is used to prevent pregnancy and can also be used to treat heavy bleeding that occurs during your period. Depending on the device, it can be used for 3 to 5 years. This medicine may be used for other purposes; ask your health care provider or pharmacist if you have questions. What should I tell my health care provider before I take this medicine? They need to know if you have any of these conditions: -abnormal Pap smear -cancer of the breast, uterus, or cervix -diabetes -endometritis -genital or pelvic infection now or in the past -have more than one sexual partner or your partner has more than one partner -heart disease -history of an ectopic or tubal pregnancy -immune system problems -IUD in place -liver disease or tumor -problems with blood clots or take blood-thinners -use intravenous drugs -uterus of unusual shape -vaginal bleeding that has not been explained -an unusual or allergic reaction to levonorgestrel, other hormones, silicone, or polyethylene, medicines, foods, dyes, or preservatives -pregnant or trying to get pregnant -breast-feeding How should I use this medicine? This device is placed inside the uterus by a health care professional. Talk to your pediatrician regarding the use of this medicine in children. Special care may be needed. Overdosage: If you think you have taken too much of this medicine contact a poison control center or emergency room at once. NOTE: This medicine is only for you. Do not share this medicine with others. What if I miss a dose? This does not apply. What may interact with this medicine? Do not take this medicine with any of the following medications: -amprenavir -bosentan -fosamprenavir This medicine may also interact with  the following medications: -aprepitant -barbiturate medicines for inducing sleep or treating seizures -bexarotene -griseofulvin -medicines to treat seizures like carbamazepine, ethotoin, felbamate, oxcarbazepine, phenytoin, topiramate -modafinil -pioglitazone -rifabutin -rifampin -rifapentine -some medicines to treat HIV infection like atazanavir, indinavir, lopinavir, nelfinavir, tipranavir, ritonavir -St. John's wort -warfarin This list may not describe all possible interactions. Give your health care provider a list of all the medicines, herbs, non-prescription drugs, or dietary supplements you use. Also tell them if you smoke, drink alcohol, or use illegal drugs. Some items may interact with your medicine. What should I watch for while using this medicine? Visit your doctor or health care professional for regular check ups. See your doctor if you or your partner has sexual contact with others, becomes HIV positive, or gets a sexual transmitted disease. This product does not protect you against HIV infection (AIDS) or other sexually transmitted diseases. You can check the placement of the IUD yourself by reaching up to the top of your vagina with clean fingers to feel the threads. Do not pull on the threads. It is a good habit to check placement after each menstrual period. Call your doctor right away if you feel more of the IUD than just the threads or if you cannot feel the threads at all. The IUD may come out by itself. You may become pregnant if the device comes out. If you notice that the IUD has come out use a backup birth control method like condoms and call your health care provider. Using tampons will not change the position of the IUD and are okay to use during your period. What side effects may I notice from receiving this medicine?   Side effects that you should report to your doctor or health care professional as soon as possible: -allergic reactions like skin rash, itching or  hives, swelling of the face, lips, or tongue -fever, flu-like symptoms -genital sores -high blood pressure -no menstrual period for 6 weeks during use -pain, swelling, warmth in the leg -pelvic pain or tenderness -severe or sudden headache -signs of pregnancy -stomach cramping -sudden shortness of breath -trouble with balance, talking, or walking -unusual vaginal bleeding, discharge -yellowing of the eyes or skin Side effects that usually do not require medical attention (report to your doctor or health care professional if they continue or are bothersome): -acne -breast pain -change in sex drive or performance -changes in weight -cramping, dizziness, or faintness while the device is being inserted -headache -irregular menstrual bleeding within first 3 to 6 months of use -nausea This list may not describe all possible side effects. Call your doctor for medical advice about side effects. You may report side effects to FDA at 1-800-FDA-1088. Where should I keep my medicine? This does not apply. NOTE: This sheet is a summary. It may not cover all possible information. If you have questions about this medicine, talk to your doctor, pharmacist, or health care provider.    2016, [employer]. (2011-09-16 13:54:04)  

## 2016-06-30 ENCOUNTER — Ambulatory Visit (HOSPITAL_COMMUNITY): Payer: BLUE CROSS/BLUE SHIELD

## 2016-07-08 ENCOUNTER — Ambulatory Visit (HOSPITAL_COMMUNITY): Payer: BLUE CROSS/BLUE SHIELD

## 2016-07-13 ENCOUNTER — Ambulatory Visit (HOSPITAL_COMMUNITY)
Admission: RE | Admit: 2016-07-13 | Discharge: 2016-07-13 | Disposition: A | Discharge status: Home / Self Care | Payer: BLUE CROSS/BLUE SHIELD | Source: Ambulatory Visit | Attending: Obstetrics & Gynecology | Admitting: Obstetrics & Gynecology

## 2016-07-13 DIAGNOSIS — Z30431 Encounter for routine checking of intrauterine contraceptive device: Secondary | ICD-10-CM

## 2016-07-13 DIAGNOSIS — N941 Unspecified dyspareunia: Secondary | ICD-10-CM | POA: Diagnosis present

## 2016-07-13 DIAGNOSIS — N854 Malposition of uterus: Secondary | ICD-10-CM | POA: Insufficient documentation

## 2016-07-13 DIAGNOSIS — R102 Pelvic and perineal pain: Secondary | ICD-10-CM

## 2017-02-03 DIAGNOSIS — Z30432 Encounter for removal of intrauterine contraceptive device: Secondary | ICD-10-CM | POA: Diagnosis not present

## 2017-03-13 DIAGNOSIS — R112 Nausea with vomiting, unspecified: Secondary | ICD-10-CM | POA: Diagnosis not present

## 2017-04-07 DIAGNOSIS — Z0000 Encounter for general adult medical examination without abnormal findings: Secondary | ICD-10-CM | POA: Diagnosis not present

## 2017-04-07 DIAGNOSIS — F40298 Other specified phobia: Secondary | ICD-10-CM | POA: Diagnosis not present

## 2017-05-30 IMAGING — US US PELVIS COMPLETE
1 series · 15 of 25 positions shown · non-contrast
Comparison: None

CLINICAL DATA: Dyspareunia

EXAM:
TRANSABDOMINAL AND TRANSVAGINAL ULTRASOUND OF PELVIS
TECHNIQUE: Both transabdominal and transvaginal ultrasound examinations of the
pelvis were performed. Transabdominal technique was performed for
global imaging of the pelvis including uterus, ovaries, adnexal
regions, and pelvic cul-de-sac. It was necessary to proceed with
endovaginal exam following the transabdominal exam to visualize the
endometrium and ovaries.

[Series 1: us pelvis complete · 15 of 68 slices shown]
[im 1/68]
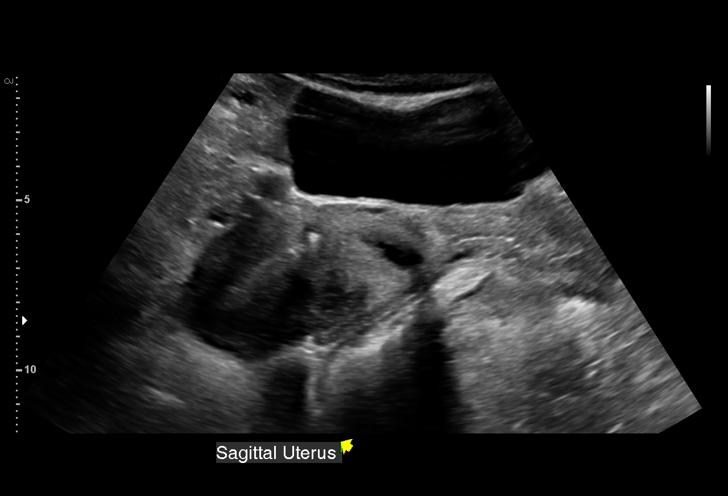
[im 6/68]
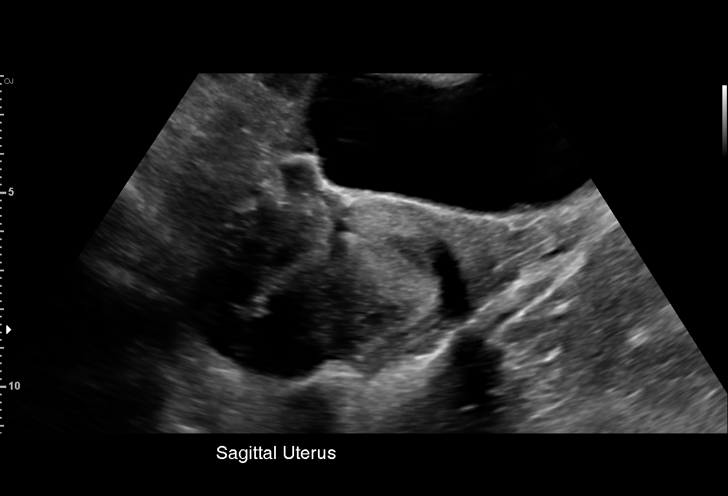
[im 12/68]
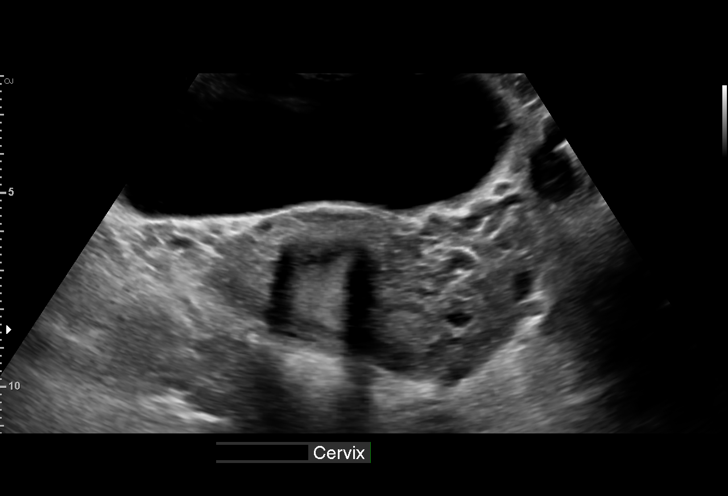
[im 14/68]
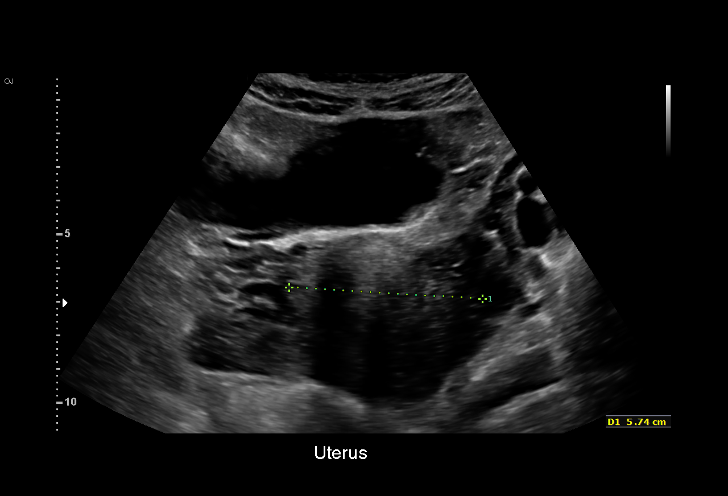
[im 20/68]
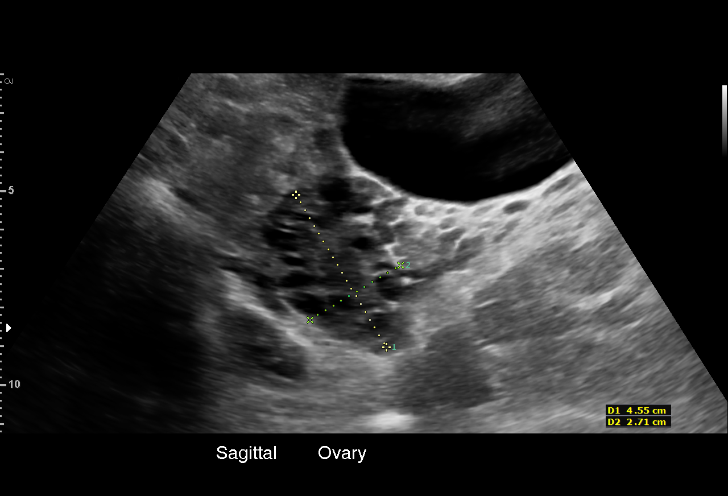
[im 26/68]
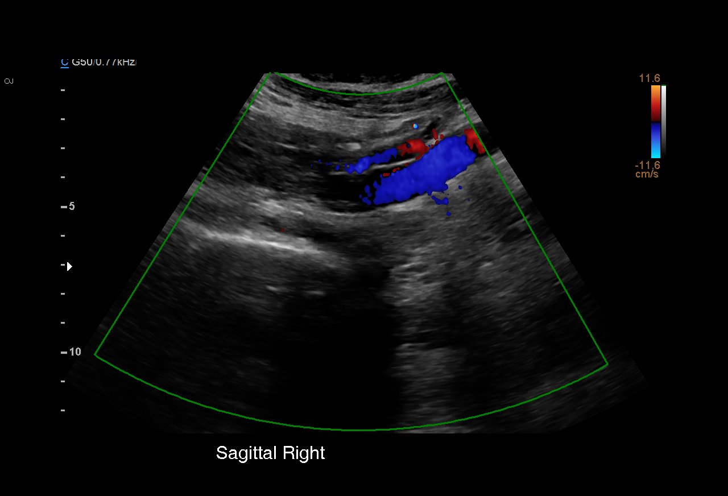
[im 28/68]
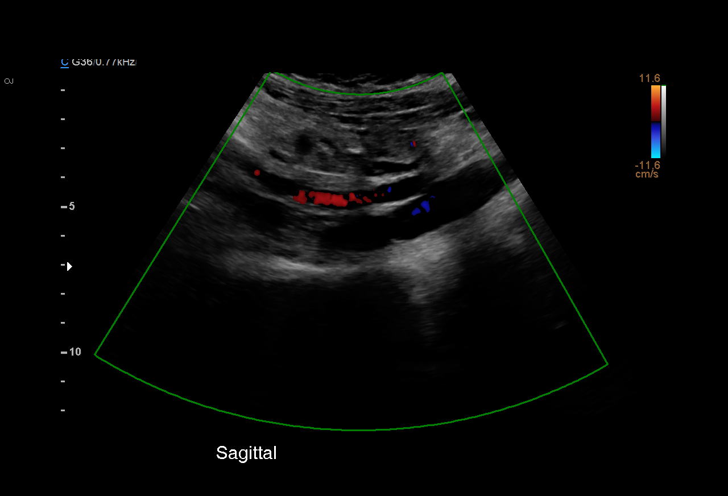
[im 34/68]
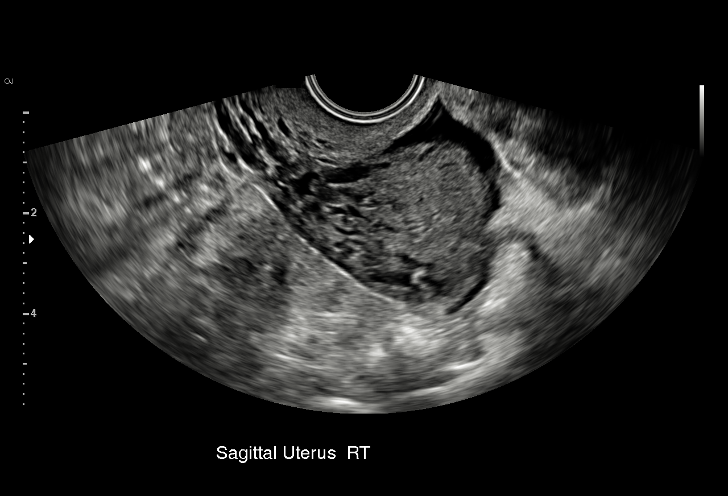
[im 40/68]
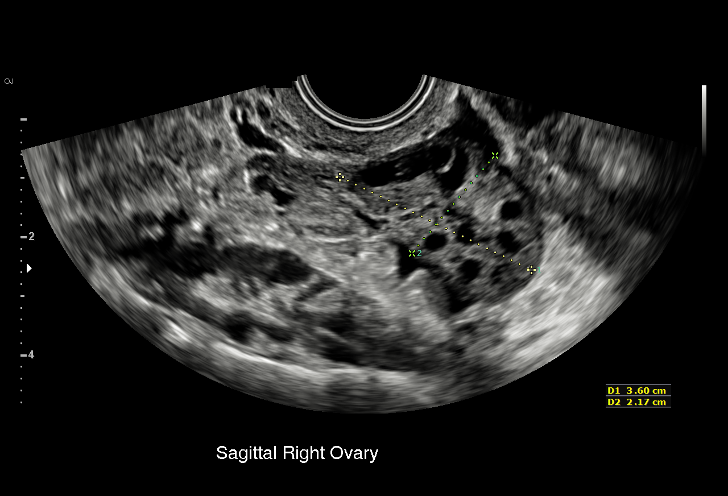
[im 42/68]
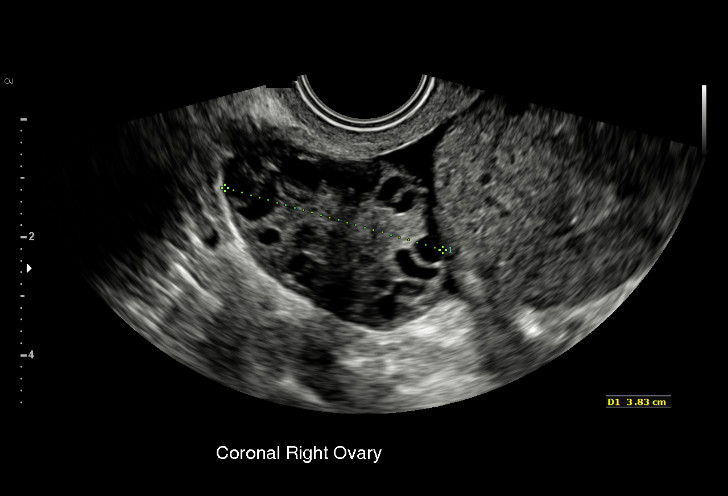
[im 48/68]
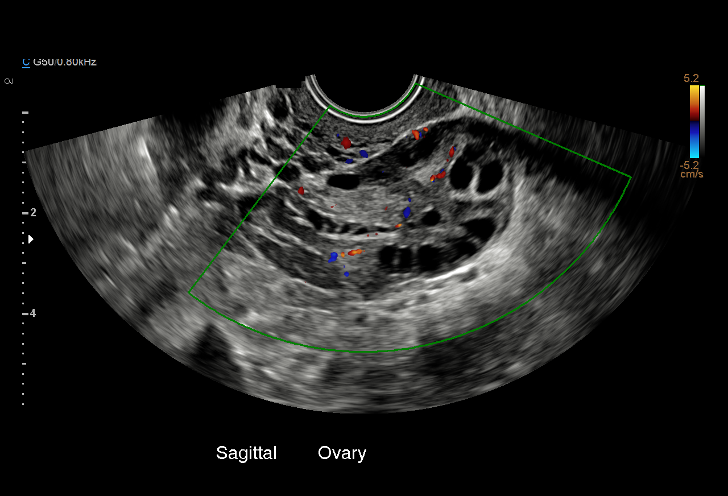
[im 54/68]
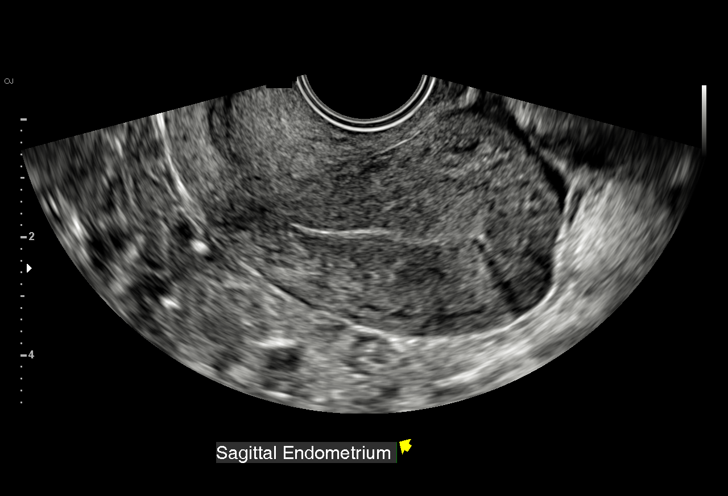
[im 56/68]
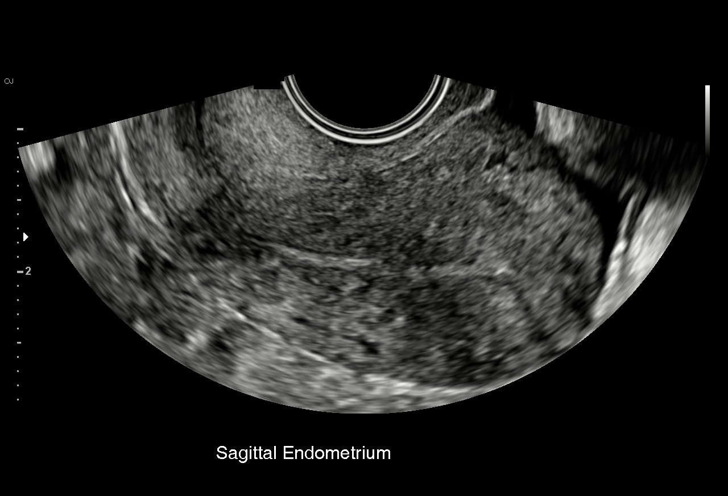
[im 62/68]
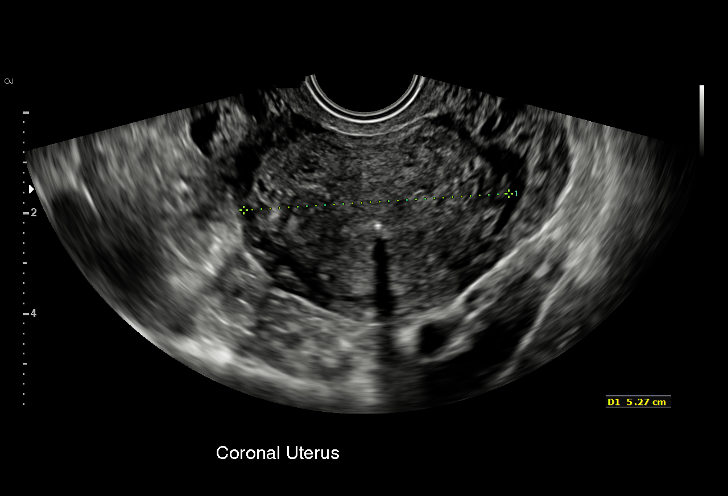
[im 68/68]
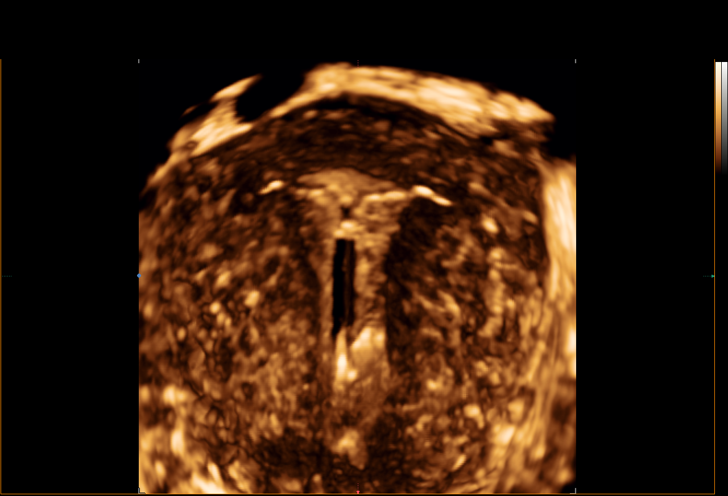

[15 of 25 positions shown; findings below may reference images not displayed]

FINDINGS: Uterus

Measurements: 8.3 x 4.2 x 5.3 cm. Uterus is retroverted. No fibroids
or other mass visualized.

Endometrium

Thickness: 3 mm in thickness. IUD noted within the endometrial
cavity in expected position.. No focal abnormality visualized.

Right ovary

Measurements: 3.6 x 2.2 x 3.8 cm. Normal appearance/no adnexal mass.

Left ovary

Measurements: 5.3 x 2.7 x 2.7 cm. Normal appearance/no adnexal mass.

Other findings

No abnormal free fluid.
IMPRESSION: IUD noted in expected position.

No acute findings.

## 2017-06-16 DIAGNOSIS — M542 Cervicalgia: Secondary | ICD-10-CM | POA: Diagnosis not present

## 2017-06-16 DIAGNOSIS — M4722 Other spondylosis with radiculopathy, cervical region: Secondary | ICD-10-CM | POA: Diagnosis not present

## 2017-06-27 DIAGNOSIS — M5412 Radiculopathy, cervical region: Secondary | ICD-10-CM | POA: Diagnosis not present

## 2017-06-27 DIAGNOSIS — M542 Cervicalgia: Secondary | ICD-10-CM | POA: Diagnosis not present

## 2017-06-29 DIAGNOSIS — M542 Cervicalgia: Secondary | ICD-10-CM | POA: Diagnosis not present

## 2017-06-29 DIAGNOSIS — M5412 Radiculopathy, cervical region: Secondary | ICD-10-CM | POA: Diagnosis not present

## 2017-07-04 DIAGNOSIS — M542 Cervicalgia: Secondary | ICD-10-CM | POA: Diagnosis not present

## 2017-07-04 DIAGNOSIS — M5412 Radiculopathy, cervical region: Secondary | ICD-10-CM | POA: Diagnosis not present

## 2017-07-06 DIAGNOSIS — M5412 Radiculopathy, cervical region: Secondary | ICD-10-CM | POA: Diagnosis not present

## 2017-07-06 DIAGNOSIS — M542 Cervicalgia: Secondary | ICD-10-CM | POA: Diagnosis not present

## 2017-07-11 DIAGNOSIS — M5412 Radiculopathy, cervical region: Secondary | ICD-10-CM | POA: Diagnosis not present

## 2017-07-11 DIAGNOSIS — M542 Cervicalgia: Secondary | ICD-10-CM | POA: Diagnosis not present

## 2017-07-13 DIAGNOSIS — M542 Cervicalgia: Secondary | ICD-10-CM | POA: Diagnosis not present

## 2017-07-13 DIAGNOSIS — M5412 Radiculopathy, cervical region: Secondary | ICD-10-CM | POA: Diagnosis not present

## 2017-07-18 DIAGNOSIS — M5412 Radiculopathy, cervical region: Secondary | ICD-10-CM | POA: Diagnosis not present

## 2017-07-18 DIAGNOSIS — M542 Cervicalgia: Secondary | ICD-10-CM | POA: Diagnosis not present

## 2017-07-20 DIAGNOSIS — M5412 Radiculopathy, cervical region: Secondary | ICD-10-CM | POA: Diagnosis not present

## 2017-07-20 DIAGNOSIS — M542 Cervicalgia: Secondary | ICD-10-CM | POA: Diagnosis not present

## 2017-07-27 DIAGNOSIS — M5412 Radiculopathy, cervical region: Secondary | ICD-10-CM | POA: Diagnosis not present

## 2017-07-27 DIAGNOSIS — M542 Cervicalgia: Secondary | ICD-10-CM | POA: Diagnosis not present

## 2017-08-03 DIAGNOSIS — M542 Cervicalgia: Secondary | ICD-10-CM | POA: Diagnosis not present

## 2017-08-03 DIAGNOSIS — M5412 Radiculopathy, cervical region: Secondary | ICD-10-CM | POA: Diagnosis not present

## 2017-08-15 DIAGNOSIS — M5412 Radiculopathy, cervical region: Secondary | ICD-10-CM | POA: Diagnosis not present

## 2017-08-15 DIAGNOSIS — M542 Cervicalgia: Secondary | ICD-10-CM | POA: Diagnosis not present

## 2017-08-22 DIAGNOSIS — M542 Cervicalgia: Secondary | ICD-10-CM | POA: Diagnosis not present

## 2017-08-22 DIAGNOSIS — M5412 Radiculopathy, cervical region: Secondary | ICD-10-CM | POA: Diagnosis not present

## 2017-08-31 DIAGNOSIS — M542 Cervicalgia: Secondary | ICD-10-CM | POA: Diagnosis not present

## 2017-08-31 DIAGNOSIS — M5412 Radiculopathy, cervical region: Secondary | ICD-10-CM | POA: Diagnosis not present

## 2017-09-07 DIAGNOSIS — M542 Cervicalgia: Secondary | ICD-10-CM | POA: Diagnosis not present

## 2017-09-07 DIAGNOSIS — M5412 Radiculopathy, cervical region: Secondary | ICD-10-CM | POA: Diagnosis not present

## 2017-09-08 DIAGNOSIS — M79671 Pain in right foot: Secondary | ICD-10-CM | POA: Diagnosis not present

## 2017-09-08 DIAGNOSIS — M542 Cervicalgia: Secondary | ICD-10-CM | POA: Diagnosis not present

## 2017-09-08 DIAGNOSIS — M79672 Pain in left foot: Secondary | ICD-10-CM | POA: Diagnosis not present

## 2017-09-15 DIAGNOSIS — G44209 Tension-type headache, unspecified, not intractable: Secondary | ICD-10-CM | POA: Diagnosis not present

## 2017-09-15 DIAGNOSIS — M4722 Other spondylosis with radiculopathy, cervical region: Secondary | ICD-10-CM | POA: Diagnosis not present

## 2017-09-20 DIAGNOSIS — M5011 Cervical disc disorder with radiculopathy,  high cervical region: Secondary | ICD-10-CM | POA: Diagnosis not present

## 2017-09-26 DIAGNOSIS — M542 Cervicalgia: Secondary | ICD-10-CM | POA: Diagnosis not present

## 2017-12-20 DIAGNOSIS — N939 Abnormal uterine and vaginal bleeding, unspecified: Secondary | ICD-10-CM | POA: Diagnosis not present

## 2017-12-20 DIAGNOSIS — R102 Pelvic and perineal pain: Secondary | ICD-10-CM | POA: Diagnosis not present

## 2017-12-26 DIAGNOSIS — R102 Pelvic and perineal pain: Secondary | ICD-10-CM | POA: Diagnosis not present

## 2018-01-03 DIAGNOSIS — Z3202 Encounter for pregnancy test, result negative: Secondary | ICD-10-CM | POA: Diagnosis not present

## 2018-01-03 DIAGNOSIS — Z113 Encounter for screening for infections with a predominantly sexual mode of transmission: Secondary | ICD-10-CM | POA: Diagnosis not present

## 2018-01-03 DIAGNOSIS — Z30430 Encounter for insertion of intrauterine contraceptive device: Secondary | ICD-10-CM | POA: Diagnosis not present

## 2018-02-06 DIAGNOSIS — Z01419 Encounter for gynecological examination (general) (routine) without abnormal findings: Secondary | ICD-10-CM | POA: Diagnosis not present

## 2018-02-06 DIAGNOSIS — Z6825 Body mass index (BMI) 25.0-25.9, adult: Secondary | ICD-10-CM | POA: Diagnosis not present

## 2018-02-15 DIAGNOSIS — Z1329 Encounter for screening for other suspected endocrine disorder: Secondary | ICD-10-CM | POA: Diagnosis not present

## 2018-02-15 DIAGNOSIS — Z13228 Encounter for screening for other metabolic disorders: Secondary | ICD-10-CM | POA: Diagnosis not present

## 2018-02-15 DIAGNOSIS — Z13220 Encounter for screening for lipoid disorders: Secondary | ICD-10-CM | POA: Diagnosis not present

## 2018-02-15 DIAGNOSIS — Z1321 Encounter for screening for nutritional disorder: Secondary | ICD-10-CM | POA: Diagnosis not present

## 2018-04-19 DIAGNOSIS — E559 Vitamin D deficiency, unspecified: Secondary | ICD-10-CM | POA: Diagnosis not present

## 2019-02-12 DIAGNOSIS — Z01419 Encounter for gynecological examination (general) (routine) without abnormal findings: Secondary | ICD-10-CM | POA: Diagnosis not present

## 2019-02-12 DIAGNOSIS — Z6823 Body mass index (BMI) 23.0-23.9, adult: Secondary | ICD-10-CM | POA: Diagnosis not present

## 2019-02-12 DIAGNOSIS — Z808 Family history of malignant neoplasm of other organs or systems: Secondary | ICD-10-CM | POA: Diagnosis not present
